# Patient Record
Sex: Male | Born: 1960 | Race: Black or African American | Hispanic: No | Marital: Married | Smoking: Former smoker
Health system: Southern US, Community
[De-identification: ages and names within clinical notes are randomized; demographics above are authoritative.]

## PROBLEM LIST (undated history)

## (undated) DIAGNOSIS — I10 Essential (primary) hypertension: Secondary | ICD-10-CM

## (undated) HISTORY — DX: Essential (primary) hypertension: I10

---

## 1986-11-04 HISTORY — PX: HEMORROIDECTOMY: SUR656

## 2017-04-12 ENCOUNTER — Encounter (HOSPITAL_COMMUNITY): Payer: Self-pay | Admitting: Emergency Medicine

## 2017-04-12 ENCOUNTER — Emergency Department (HOSPITAL_COMMUNITY)
Admission: EM | Admit: 2017-04-12 | Discharge: 2017-04-12 | Disposition: A | Discharge status: Home / Self Care | Payer: Self-pay | Attending: Emergency Medicine | Admitting: Emergency Medicine

## 2017-04-12 DIAGNOSIS — Z79899 Other long term (current) drug therapy: Secondary | ICD-10-CM | POA: Insufficient documentation

## 2017-04-12 DIAGNOSIS — R040 Epistaxis: Secondary | ICD-10-CM | POA: Insufficient documentation

## 2017-04-12 DIAGNOSIS — I10 Essential (primary) hypertension: Secondary | ICD-10-CM | POA: Insufficient documentation

## 2017-04-12 DIAGNOSIS — F17200 Nicotine dependence, unspecified, uncomplicated: Secondary | ICD-10-CM | POA: Insufficient documentation

## 2017-04-12 MED ORDER — SALINE SPRAY 0.65 % NA SOLN: 2.0000 | NASAL | 0 refills | Status: AC | PRN

## 2017-04-12 MED ORDER — HYDROCHLOROTHIAZIDE 25 MG PO TABS: 25.0000 mg | ORAL_TABLET | Freq: Every day | ORAL | 0 refills | Status: DC

## 2017-04-12 MED ORDER — OXYMETAZOLINE HCL 0.05 % NA SOLN: 1.0000 | Freq: Two times a day (BID) | NASAL | 0 refills | Status: AC | PRN

## 2017-04-12 NOTE — Discharge Instructions (Signed)
Read the information below.  Use the prescribed medication as directed.  Please discuss all new medications with your pharmacist.  You may return to the Emergency Department at any time for worsening condition or any new symptoms that concern you.    Nosebleeds commonly recur.  If your nose starts bleeding again and doesn't stop after 10-15 minutes of constant pressure squeezing the soft part of your nose, then re-apply the direct pressure and return to the ED. Return also immediately to the nearest ED if you develop chest pain, shortness of breath, dizziness or fainting, severe bleeding, or other concerns.

## 2017-04-12 NOTE — ED Triage Notes (Signed)
Patient here from home with complaints of nosebleed " for a couple of weeks now off and on". Unable to control bleeding. Currently bleeding from the left side.

## 2017-04-12 NOTE — ED Provider Notes (Signed)
[facility]-EMERGENCY DEPT Provider Note   CSN: [id number] Arrival date & time: 04/12/17  1740   By signing my name below, I, Clarisse Gouge, attest that this documentation has been prepared under the direction and in the presence of [facility], PA-C. Electronically Signed: Clarisse Gouge, Scribe. 04/12/17. 6:08 PM.   History   Chief Complaint Chief Complaint  Patient presents with  . Epistaxis   The history is provided by the patient and medical records. A language interpreter was used (Pt's son acting as interpretor on evaluation.).    Ruben Mckinney is a 56 y.o. male presenting to the Emergency Department with chief complaint of persistent nosebleeds on th L sided x 8 days. Pt also notes L sided facial pain. 3 episodes of nosebleeds noted today; pt has had 1 episode daily otherwise since onset. He states today's episodes lasted ~10 minutes at a time and subsided with pressure. Pt currently taking prescribed 10 mg amlodipine once nightly from [place] to control chronic HTN. Pt also taking aspirin daily. He notes recent blood pressure averages of 130/90-180/90 at home. Pt denies any other blood thinner use. No PCP yet. pt moved to this country ~3 months ago. No recent URI symptoms noted. No seasonal allergies.   History reviewed. No pertinent past medical history.  There are no active problems to display for this patient.   History reviewed. No pertinent surgical history.     Home Medications    Prior to Admission medications   Medication Sig Start Date End Date Taking? Authorizing Provider  hydrochlorothiazide (HYDRODIURIL) 25 MG tablet Take 1 tablet (25 mg total) by mouth daily. For high blood pressure 04/12/17   Nicholis Stepanek, PA-C  oxymetazoline (AFRIN NASAL SPRAY) 0.05 % nasal spray Place 1 spray into left nostril 2 (two) times daily as needed (nosebleed). 04/12/17   Trixie Dredge, PA-C  sodium chloride (OCEAN) 0.65 % SOLN nasal spray Place 2 sprays into both nostrils as needed for congestion  (moisturizing). 04/12/17   Trixie Dredge, PA-C    Family History No family history on file.  Social History Social History  Substance Use Topics  . Smoking status: Current Every Day Smoker  . Smokeless tobacco: Never Used  . Alcohol use No     Allergies   Patient has no allergy information on record.   Review of Systems Review of Systems  Constitutional: Negative for chills and fever.  HENT: Positive for nosebleeds. Negative for congestion, facial swelling, rhinorrhea, sinus pain, sinus pressure, sneezing and trouble swallowing.   Respiratory: Negative for cough.   Allergic/Immunologic: Negative for environmental allergies and immunocompromised state.  All other systems reviewed and are negative.    Physical Exam Updated Vital Signs BP (!) 196/86 (BP Location: Left Arm)   Pulse 94   Resp 16   SpO2 99%   Physical Exam  Constitutional: He appears well-developed and well-nourished. No distress.  HENT:  Head: Normocephalic and atraumatic.  Mouth/Throat: Oropharynx is clear and moist and mucous membranes are normal. No oropharyngeal exudate, posterior oropharyngeal edema, posterior oropharyngeal erythema or tonsillar abscesses.  Small amount of dried blood at the tip of the L nare. No active bleeding.  Eyes: Conjunctivae and EOM are normal. Right eye exhibits no discharge. Left eye exhibits no discharge.  Neck: Normal range of motion. Neck supple.  Cardiovascular: Normal rate and regular rhythm.   Pulmonary/Chest: Effort normal and breath sounds normal. No stridor. No respiratory distress. He has no wheezes. He has no rales.  Lymphadenopathy:    He  has no cervical adenopathy.  Neurological: He is alert.  Skin: He is not diaphoretic.  Nursing note and vitals reviewed.    ED Treatments / Results  DIAGNOSTIC STUDIES: Oxygen Saturation is 99% on RA, NL by my interpretation.    COORDINATION OF CARE: 6:04 PM-Discussed next steps with pt. Pt verbalized understanding and is  agreeable with the plan. Will order medication and refer to PCP and HENT specialist.   Labs (all labs ordered are listed, but only abnormal results are displayed) Labs Reviewed - No data to display  EKG  EKG Interpretation None       Radiology No results found.  Procedures Procedures (including critical care time)  Medications Ordered in ED Medications - No data to display   Initial Impression / Assessment and Plan / ED Course  I have reviewed the triage vital signs and the nursing notes.  Pertinent labs & imaging results that were available during my care of the patient were reviewed by me and considered in my medical decision making (see chart for details).     Afebrile, nontoxic patient with intermittent epistaxis from the left nare over the past 8 days.  No active bleeding currently.  No vessels noted on left that need cautery.  Pt is hypertensive, reports persistent hypertension despite taking amlodipine 10mg  nightly.  No URI symptoms, no seasonal allergies, no sinus tenderness or suspected sinusitis.   D/C home with nasal sprays, addition of HCTZ for better blood pressure control, referrals for PCP follow up, ENT follow up PRN.  Discussed result, findings, treatment, and follow up  with patient.  Pt given return precautions.  Pt verbalizes understanding and agrees with plan.       Final Clinical Impressions(s) / ED Diagnoses   Final diagnoses:  Left-sided epistaxis  Hypertension, unspecified type    New Prescriptions Discharge Medication List as of 04/12/2017  6:24 PM    START taking these medications   Details  hydrochlorothiazide (HYDRODIURIL) 25 MG tablet Take 1 tablet (25 mg total) by mouth daily. For high blood pressure, Starting Sat 04/12/2017, Print    oxymetazoline (AFRIN NASAL SPRAY) 0.05 % nasal spray Place 1 spray into left nostril 2 (two) times daily as needed (nosebleed)., Starting Sat 04/12/2017, Print    sodium chloride (OCEAN) 0.65 % SOLN nasal spray  Place 2 sprays into both nostrils as needed for congestion (moisturizing)., Starting Sat 04/12/2017, Print        I personally performed the services described in this documentation, which was scribed in my presence. The recorded information has been reviewed and is accurate.    Trixie DredgeWest, Jaskaran Dauzat, PA-C 04/12/17 1843    Trixie DredgeWest, Kamaal Cast, PA-C 04/12/17 1845    Tilden Fossaees, Elizabeth, MD 04/13/17 [phone]

## 2017-08-21 ENCOUNTER — Ambulatory Visit: Payer: BLUE CROSS/BLUE SHIELD | Admitting: Family Medicine

## 2017-08-22 ENCOUNTER — Encounter: Payer: Self-pay | Admitting: Family Medicine

## 2017-08-22 ENCOUNTER — Ambulatory Visit (INDEPENDENT_AMBULATORY_CARE_PROVIDER_SITE_OTHER): Payer: BLUE CROSS/BLUE SHIELD

## 2017-08-22 VITALS — BP 132/82 | HR 77 | Temp 98.8°F | Resp 17 | Ht 71.0 in | Wt 250.0 lb

## 2017-08-22 DIAGNOSIS — J189 Pneumonia, unspecified organism: Secondary | ICD-10-CM

## 2017-08-22 DIAGNOSIS — R062 Wheezing: Secondary | ICD-10-CM | POA: Diagnosis not present

## 2017-08-22 DIAGNOSIS — I10 Essential (primary) hypertension: Secondary | ICD-10-CM

## 2017-08-22 DIAGNOSIS — R6889 Other general symptoms and signs: Secondary | ICD-10-CM

## 2017-08-22 DIAGNOSIS — R05 Cough: Secondary | ICD-10-CM

## 2017-08-22 DIAGNOSIS — R059 Cough, unspecified: Secondary | ICD-10-CM

## 2017-08-22 DIAGNOSIS — J181 Lobar pneumonia, unspecified organism: Secondary | ICD-10-CM | POA: Diagnosis not present

## 2017-08-22 DIAGNOSIS — J069 Acute upper respiratory infection, unspecified: Secondary | ICD-10-CM

## 2017-08-22 LAB — POCT CBC
Granulocyte percent: 77.3 %G (ref 37–80)
HEMATOCRIT: 41.3 % — AB (ref 43.5–53.7)
Hemoglobin: 13.8 g/dL — AB (ref 14.1–18.1)
Lymph, poc: 1.5 (ref 0.6–3.4)
MCH, POC: 29.4 pg (ref 27–31.2)
MCHC: 33.4 g/dL (ref 31.8–35.4)
MCV: 87.9 fL (ref 80–97)
MID (cbc): 0.8 (ref 0–0.9)
MPV: 6.6 fL (ref 0–99.8)
POC GRANULOCYTE: 8 — AB (ref 2–6.9)
POC LYMPH %: 14.7 % (ref 10–50)
POC MID %: 8 %M (ref 0–12)
Platelet Count, POC: 311 10*3/uL (ref 142–424)
RBC: 4.7 M/uL (ref 4.69–6.13)
RDW, POC: 13.8 %
WBC: 10.4 10*3/uL — AB (ref 4.6–10.2)

## 2017-08-22 MED ORDER — AMOXICILLIN-POT CLAVULANATE 875-125 MG PO TABS: 1.0000 | ORAL_TABLET | Freq: Two times a day (BID) | ORAL | 0 refills | Status: AC

## 2017-08-22 MED ORDER — BENZONATATE 100 MG PO CAPS: 100.0000 mg | ORAL_CAPSULE | Freq: Three times a day (TID) | ORAL | 0 refills | Status: AC | PRN

## 2017-08-22 MED ORDER — HYDROCHLOROTHIAZIDE 25 MG PO TABS: 25.0000 mg | ORAL_TABLET | Freq: Every day | ORAL | 1 refills | Status: DC

## 2017-08-22 MED ORDER — CEFTRIAXONE SODIUM 1 G IJ SOLR
1.0000 g | Freq: Once | INTRAMUSCULAR | Status: AC
  Administered 2017-08-22: 1 g via INTRAMUSCULAR

## 2017-08-22 MED ORDER — ALBUTEROL SULFATE (2.5 MG/3ML) 0.083% IN NEBU
2.5000 mg | INHALATION_SOLUTION | Freq: Once | RESPIRATORY_TRACT | Status: AC
  Administered 2017-08-22: 2.5 mg via RESPIRATORY_TRACT

## 2017-08-22 NOTE — Patient Instructions (Addendum)
Drink lots of fluids (water, juice)  Take amoxicillin clavulanate antibiotic 875 mg (Augmentin) twice daily  You have received a shot of ceftriaxone 1 gm antibiotic  Use the albuterol inhaler 2 puffs every 4-6 hours.    Take benzonotate cough pills 1 or 2 pills every 8 hours for cough  Take over-the-counter robitussin DM cough syrup if needed for cough  Stay off work until Tuesday 08/26/17  If worse return or go to the emergency room  Continue same blood pressure medicine   Community-Acquired Pneumonia, Adult Pneumonia is an infection of the lungs. One type of pneumonia can happen while a person is in a hospital. A different type can happen when a person is not in a hospital (community-acquired pneumonia). It is easy for this kind to spread from person to person. It can spread to you if you breathe near an infected person who coughs or sneezes. Some symptoms include:  A dry cough.  A wet (productive) cough.  Fever.  Sweating.  Chest pain.  Follow these instructions at home:  Take over-the-counter and prescription medicines only as told by your doctor. ? Only take cough medicine if you are losing sleep. ? If you were prescribed an antibiotic medicine, take it as told by your doctor. Do not stop taking the antibiotic even if you start to feel better.  Sleep with your head and neck raised (elevated). You can do this by putting a few pillows under your head, or you can sleep in a recliner.  Do not use tobacco products. These include cigarettes, chewing tobacco, and e-cigarettes. If you need help quitting, ask your doctor.  Drink enough water to keep your pee (urine) clear or pale yellow. A shot (vaccine) can help prevent pneumonia. Shots are often suggested for:  People older than 57 years of age.  People older than 57 years of age: ? Who are having cancer treatment. ? Who have long-term (chronic) lung disease. ? Who have problems with their body's defense system  (immune system).  You may also prevent pneumonia if you take these actions:  Get the flu (influenza) shot every year.  Go to the dentist as often as told.  Wash your hands often. If soap and water are not available, use hand sanitizer.  Contact a doctor if:  You have a fever.  You lose sleep because your cough medicine does not help. Get help right away if:  You are short of breath and it gets worse.  You have more chest pain.  Your sickness gets worse. This is very serious if: ? You are an older adult. ? Your body's defense system is weak.  You cough up blood. This information is not intended to replace advice given to you by your health care provider. Make sure you discuss any questions you have with your health care provider. Document Released: 04/08/2008 Document Revised: 03/28/2016 Document Reviewed: 02/15/2015 Elsevier Interactive Patient Education  2018 [employer]   IF you received an x-ray today, you will receive an invoice from [facility]. Please contact [facility] at [phone] with questions or concerns regarding your invoice.   IF you received labwork today, you will receive an invoice from [facility]. Please contact [facility] at [phone] with questions or concerns regarding your invoice.   Our billing staff will not be able to assist you with questions regarding bills from these companies.  You will be contacted with the lab results as soon as they are available. The fastest way to get your results  is to activate your My Chart account. Instructions are located on the last page of this paperwork. If you have not heard from us regarding the results in 2 weeks, please contact this office.     

## 2017-08-22 NOTE — Progress Notes (Signed)
Patient ID: Ruben Mckinney, male    DOB: April 28, 1961  Age: 56 y.o. MRN: [id number]  Chief Complaint  Patient presents with  . Cough  . Nasal Congestion  . Sore Throat    Subjective:   56 year old man from [place] who is been here for about 6 months. He speaks broken [place]. He has been sick for several days with a cough, nasal congestion and sore throat. He does not smoke. His wife was sick but she got over it without seeing a doctor. The patient actually has been sick since early last week. He continues to go to work every day. He works a long way from here at a [employer]. He has a history of hypertension and is on medication for that. He also has had problems with epistaxis in the past. He has not been running a documented fever but he does feel bad. He is coughing up purulent phlegm.  Current allergies, medications, problem list, past/family and social histories reviewed.  Objective:  BP 132/82   Pulse 77   Temp 98.8 F (37.1 C) (Oral)   Resp 17   Ht 5\' 11"  (1.803 m)   Wt 250 lb (113.4 kg)   SpO2 98%   BMI 34.87 kg/m   No major distress but he does look ill. His TMs are normal. Throat mildly erythematous without exudate. Neck supple without nodes. Chest has scattered wheezes bilaterally, more in the left lower lung area. Heart regular without murmur. Abdomen soft without mass or tenderness.  Assessment & Plan:   Assessment: 1. Pneumonia of both lower lobes due to infectious organism (HCC)   2. Cough   3. Wheeze   4. Acute upper respiratory infection   5. Flu-like symptoms   6. Essential hypertension       Plan: Will give an albuterol treatment and proceed from there.    Orders Placed This Encounter  Procedures  . DG Chest 2 View    Standing Status:   Future    Number of Occurrences:   1    Standing Expiration Date:   08/22/2018    Order Specific Question:   Reason for Exam (SYMPTOM  OR DIAGNOSIS REQUIRED)    Answer:   wheezing    Order Specific Question:    Preferred imaging location?    Answer:   External  . POCT CBC    Meds ordered this encounter  Medications  . albuterol (PROVENTIL) (2.5 MG/3ML) 0.083% nebulizer solution 2.5 mg  . cefTRIAXone (ROCEPHIN) injection 1 g  . amoxicillin-clavulanate (AUGMENTIN) 875-125 MG tablet    Sig: Take 1 tablet by mouth 2 (two) times daily.    Dispense:  20 tablet    Refill:  0  . benzonatate (TESSALON) 100 MG capsule    Sig: Take 1-2 capsules (100-200 mg total) by mouth 3 (three) times daily as needed.    Dispense:  30 capsule    Refill:  0  . hydrochlorothiazide (HYDRODIURIL) 25 MG tablet    Sig: Take 1 tablet (25 mg total) by mouth daily. For high blood pressure    Dispense:  90 tablet    Refill:  1    Results for orders placed or performed in visit on 08/22/17  POCT CBC  Result Value Ref Range   WBC 10.4 (A) 4.6 - 10.2 K/uL   Lymph, poc 1.5 0.6 - 3.4   POC LYMPH PERCENT 14.7 10 - 50 %L   MID (cbc) 0.8 0 - 0.9   POC MID %  8.0 0 - 12 %M   POC Granulocyte 8.0 (A) 2 - 6.9   Granulocyte percent 77.3 37 - 80 %G   RBC 4.70 4.69 - 6.13 M/uL   Hemoglobin 13.8 (A) 14.1 - 18.1 g/dL   HCT, POC 13.041.3 (A) 86.543.5 - 53.7 %   MCV 87.9 80 - 97 fL   MCH, POC 29.4 27 - 31.2 pg   MCHC 33.4 31.8 - 35.4 g/dL   RDW, POC 78.413.8 %   Platelet Count, POC 311 142 - 424 K/uL   MPV 6.6 0 - 99.8 fL    Explained through interpreter.    Patient Instructions    Drink lots of fluids (water, juice)  Take amoxicillin clavulanate antibiotic 875 mg (Augmentin) twice daily  You have received a shot of ceftriaxone 1 gm antibiotic  Use the albuterol inhaler 2 puffs every 4-6 hours.    Take benzonotate cough pills 1 or 2 pills every 8 hours for cough  Take over-the-counter robitussin DM cough syrup if needed for cough  Stay off work until Tuesday 08/26/17  If worse return or go to the emergency room  Continue same blood pressure medicine   IF you received an x-ray today, you will receive an invoice from  [facility]. Please contact [facility] at [phone] with questions or concerns regarding your invoice.   IF you received labwork today, you will receive an invoice from [facility]. Please contact [facility] at [phone] with questions or concerns regarding your invoice.   Our billing staff will not be able to assist you with questions regarding bills from these companies.  You will be contacted with the lab results as soon as they are available. The fastest way to get your results is to activate your My Chart account. Instructions are located on the last page of this paperwork. If you have not heard from us regarding the results in 2 weeks, please contact this office.         No Follow-up on file.   HOPPER,DAVID, MD 08/22/2017

## 2017-08-23 ENCOUNTER — Other Ambulatory Visit: Payer: Self-pay | Admitting: Family Medicine

## 2017-08-23 MED ORDER — ALBUTEROL SULFATE HFA 108 (90 BASE) MCG/ACT IN AERS: 2.0000 | INHALATION_SPRAY | Freq: Four times a day (QID) | RESPIRATORY_TRACT | 0 refills | Status: DC | PRN

## 2017-08-23 NOTE — Progress Notes (Signed)
Family notified that medication is at [facility] - albuterol was prescribed but not called in  Sent electronically

## 2017-08-25 ENCOUNTER — Ambulatory Visit (INDEPENDENT_AMBULATORY_CARE_PROVIDER_SITE_OTHER): Payer: BLUE CROSS/BLUE SHIELD | Admitting: Family Medicine

## 2017-08-25 ENCOUNTER — Encounter: Payer: Self-pay | Admitting: Family Medicine

## 2017-08-25 VITALS — BP 132/76 | HR 78 | Temp 98.6°F | Resp 16 | Ht 70.0 in | Wt 244.4 lb

## 2017-08-25 DIAGNOSIS — J189 Pneumonia, unspecified organism: Secondary | ICD-10-CM

## 2017-08-25 DIAGNOSIS — R062 Wheezing: Secondary | ICD-10-CM

## 2017-08-25 DIAGNOSIS — Z789 Other specified health status: Secondary | ICD-10-CM

## 2017-08-25 DIAGNOSIS — J181 Lobar pneumonia, unspecified organism: Secondary | ICD-10-CM

## 2017-08-25 MED ORDER — PREDNISONE 20 MG PO TABS: ORAL_TABLET | ORAL | 0 refills | Status: AC

## 2017-08-25 MED ORDER — ALBUTEROL SULFATE HFA 108 (90 BASE) MCG/ACT IN AERS: 2.0000 | INHALATION_SPRAY | Freq: Four times a day (QID) | RESPIRATORY_TRACT | 0 refills | Status: AC | PRN

## 2017-08-25 NOTE — Progress Notes (Addendum)
Patient ID: Ruben Mckinney, male    DOB: June 27, 1961  Age: 56 y.o. MRN: [id number]  Chief Complaint  Patient presents with  . Follow-up    1019/2018 Pneumonia  per patient some improvement, but need more rest    Subjective:   Patient is here for follow-up of his pneumonia. He is breathing better but still short of breath and feels like he needs to be off work while longer he was not taking the Augmentin correctly. He only took it once a day. We went on line with the interpreter and had a long discussion explaining everything to him and his son and I believe they understand it now. They have only been in the [place] for about 6 months from [place].   No fever. Wheezing.   Current allergies, medications, problem list, past/family and social histories reviewed.  Objective:  BP 132/76 (BP Location: Left Arm, Patient Position: Sitting, Cuff Size: Large)   Pulse 78   Temp 98.6 F (37 C) (Oral)   Resp 16   Ht 5\' 10"  (1.778 m)   Wt 244 lb 6.4 oz (110.9 kg)   SpO2 96%   BMI 35.07 kg/m   No major acute distress. Scattered rhonchi and wheezes throughout both lungs but seems to have a little better air movement than he did last week. He was not clear on the use of the inhaler so that was explained and demonstrated to him also.  Assessment & Plan:   Assessment: 1. Pneumonia of both lower lobes due to infectious organism (HCC)   2. Wheezing   3. Language barrier       Plan: Pneumonia slowly improving, continue medications, stay off work a few more days, return if worse. See instructions. Spent 30 minutes with online interpreter explaining everything.    No orders of the defined types were placed in this encounter.   Meds ordered this encounter  Medications  . albuterol (PROVENTIL HFA;VENTOLIN HFA) 108 (90 Base) MCG/ACT inhaler    Sig: Inhale 2 puffs into the lungs every 6 (six) hours as needed for wheezing or shortness of breath.    Dispense:  1 Inhaler    Refill:  0  . predniSONE  (DELTASONE) 20 MG tablet    Sig: Take 3 daily for 2 days then 2 daily for 2 days then 1 daily for 2 days.    Dispense:  12 tablet    Refill:  0         Patient Instructions   Drink lots of fluids (water, juice)  Continue to take amoxicillin clavulanate antibiotic 875 mg (Augmentin) twice (1x2x10) daily  Use the albuterol inhaler 2 puffs every 4-6 hours.  Only use if needed for wheezing or shortness of breath.  Take prednisone 20 mg 3 pills x1 x2 2 days, then 2x1x2, then 1x1x2    Continue to take benzonotate cough pills 1 or 2 pills every 8 hours for cough  Take over-the-counter robitussin DM cough syrup if needed for cough  Stay off work until Thursday 08/28/17  If worse return or go to the emergency room  Continue same blood pressure medicine  Return if worse or not improving.    IF you received an x-ray today, you will receive an invoice from [facility]. Please contact [facility] at [phone] with questions or concerns regarding your invoice.   IF you received labwork today, you will receive an invoice from [place]. Please contact [facility] at [phone] with questions or concerns regarding your invoice.  Our billing staff will not be able to assist you with questions regarding bills from these companies.  You will be contacted with the lab results as soon as they are available. The fastest way to get your results is to activate your My Chart account. Instructions are located on the last page of this paperwork. If you have not heard from us regarding the results in 2 weeks, please contact this office.        No Follow-up on file.   Giannie Soliday, MD 08/25/2017

## 2017-08-25 NOTE — Patient Instructions (Addendum)
Drink lots of fluids (water, juice)  Continue to take amoxicillin clavulanate antibiotic 875 mg (Augmentin) twice (1x2x10) daily  Use the albuterol inhaler 2 puffs every 4-6 hours.  Only use if needed for wheezing or shortness of breath.  Take prednisone 20 mg 3 pills x1 x2 2 days, then 2x1x2, then 1x1x2    Continue to take benzonotate cough pills 1 or 2 pills every 8 hours for cough  Take over-the-counter robitussin DM cough syrup if needed for cough  Stay off work until Thursday 08/28/17  If worse return or go to the emergency room  Continue same blood pressure medicine  Return if worse or not improving.    IF you received an x-ray today, you will receive an invoice from [facility]. Please contact [facility] at [phone] with questions or concerns regarding your invoice.   IF you received labwork today, you will receive an invoice from [facility]. Please contact [facility] at [phone] with questions or concerns regarding your invoice.   Our billing staff will not be able to assist you with questions regarding bills from these companies.  You will be contacted with the lab results as soon as they are available. The fastest way to get your results is to activate your My Chart account. Instructions are located on the last page of this paperwork. If you have not heard from us regarding the results in 2 weeks, please contact this office.

## 2017-08-30 ENCOUNTER — Ambulatory Visit (INDEPENDENT_AMBULATORY_CARE_PROVIDER_SITE_OTHER): Payer: BLUE CROSS/BLUE SHIELD | Admitting: Physician Assistant

## 2017-08-30 ENCOUNTER — Encounter: Payer: Self-pay | Admitting: Physician Assistant

## 2017-08-30 VITALS — BP 122/86 | HR 82 | Temp 98.2°F | Resp 18 | Ht 70.0 in | Wt 248.0 lb

## 2017-08-30 DIAGNOSIS — J181 Lobar pneumonia, unspecified organism: Secondary | ICD-10-CM | POA: Diagnosis not present

## 2017-08-30 DIAGNOSIS — J189 Pneumonia, unspecified organism: Secondary | ICD-10-CM

## 2017-08-30 NOTE — Patient Instructions (Addendum)
  I think it is okay that you go back to work on Monday as long as you are still feeling as well as you are today.  I recommend taking it easy at work and not exerting herself to your full capacity at this time.  Advance work as tolerated.  Also make sure you complete your antibiotics.  Make sure you wear your protective gear at work.  Please return in 4 weeks for repeat chest x-ray to ensure that your pneumonia has fully resolved.  Please return sooner if any of her symptoms return or start to worsen. Thank you for letting me participate in your health and well being.    IF you received an x-ray today, you will receive an invoice from [facility]. Please contact [facility] at [phone] with questions or concerns regarding your invoice.   IF you received labwork today, you will receive an invoice from [facility]. Please contact [facility] at [phone] with questions or concerns regarding your invoice.   Our billing staff will not be able to assist you with questions regarding bills from these companies.  You will be contacted with the lab results as soon as they are available. The fastest way to get your results is to activate your My Chart account. Instructions are located on the last page of this paperwork. If you have not heard from us regarding the results in 2 weeks, please contact this office.

## 2017-08-30 NOTE — Progress Notes (Signed)
Ruben RousHamed Mckinney  MRN: [id number] DOB: 11/01/1961  Subjective:  Ruben Mckinney is a 56 y.o. male seen in office today for a chief complaint of work clearance.  Patient initially seen on 08/22/17 by Dr. Alwyn RenHopper and found to have bibasilar pneumonia.  He was treated with Augmentin.Returned on 08/25/17 and was doing better but noted he was still SOB and felt as if he needed to be out of work a little while longer. He has been resting since then. Taking abx as prescribed.  Patient reports he is doing much better better.  He feels back to himself.  Has occasional cough in the morning.  Denies purulent sputum production, fatigue, chest pain, shortness of breath, fever, chills, diaphoresis, and sore throat.  He works at the [employer] and has a form that needs to be completed before he can go back to work.  He wears  protective gear while at work.  He believes he can perform his job functions without any restrictions at this time.  Review of Systems  Per HPI  There are no active problems to display for this patient.   Current Outpatient Prescriptions on File Prior to Visit  Medication Sig Dispense Refill  . albuterol (PROVENTIL HFA;VENTOLIN HFA) 108 (90 Base) MCG/ACT inhaler Inhale 2 puffs into the lungs every 6 (six) hours as needed for wheezing or shortness of breath. 1 Inhaler 0  . amoxicillin-clavulanate (AUGMENTIN) 875-125 MG tablet Take 1 tablet by mouth 2 (two) times daily. 20 tablet 0  . benzonatate (TESSALON) 100 MG capsule Take 1-2 capsules (100-200 mg total) by mouth 3 (three) times daily as needed. 30 capsule 0  . hydrochlorothiazide (HYDRODIURIL) 25 MG tablet Take 1 tablet (25 mg total) by mouth daily. For high blood pressure 90 tablet 1  . predniSONE (DELTASONE) 20 MG tablet Take 3 daily for 2 days then 2 daily for 2 days then 1 daily for 2 days. 12 tablet 0  . oxymetazoline (AFRIN NASAL SPRAY) 0.05 % nasal spray Place 1 spray into left nostril 2 (two) times daily as needed  (nosebleed). (Patient not taking: Reported on 08/25/2017) 30 mL 0  . sodium chloride (OCEAN) 0.65 % SOLN nasal spray Place 2 sprays into both nostrils as needed for congestion (moisturizing). (Patient not taking: Reported on 08/25/2017) 480 mL 0   No current facility-administered medications on file prior to visit.     No Known Allergies   Objective:  BP 122/86   Pulse 82   Temp 98.2 F (36.8 C) (Oral)   Resp 18   Ht 5\' 10"  (1.778 m)   Wt 248 lb (112.5 kg)   SpO2 95%   BMI 35.58 kg/m   Physical Exam  Constitutional: He is oriented to person, place, and time and well-developed, well-nourished, and in no distress.  HENT:  Head: Normocephalic and atraumatic.  Mouth/Throat: Uvula is midline, oropharynx is clear and moist and mucous membranes are normal.  Eyes: Conjunctivae are normal.  Neck: Normal range of motion.  Pulmonary/Chest: Effort normal. He has no decreased breath sounds. He has wheezes. He has rhonchi (mild scattered rhonchi and wheezes noted, more noticable on left lower lobe). He has no rales.  Neurological: He is alert and oriented to person, place, and time. Gait normal.  Skin: Skin is warm and dry.  Psychiatric: Affect normal.  Vitals reviewed.  Pulse oximetry while ambulating remains 96-98%  Assessment and Plan :  1. Pneumonia of both lower lobes due to infectious organism [facility](HCC) Improving. I believe  pt can return to work on Monday, 09/01/17. I have completed the paperwork. Encouraged him to take it easy for a few days and advance work as tolerated.  Complete abx. Return if any of his sx worsen. Otherwise, return in 4 weeks for repeat CXR to ensure resolution of pneumonia.  - Check Pulse Oximetry while ambulating   Benjiman Core PA-C  [facility] at [facility] 08/30/2017 2:14 PM

## 2017-09-29 ENCOUNTER — Ambulatory Visit: Payer: BLUE CROSS/BLUE SHIELD | Admitting: Family Medicine

## 2017-09-29 ENCOUNTER — Ambulatory Visit (INDEPENDENT_AMBULATORY_CARE_PROVIDER_SITE_OTHER): Payer: BLUE CROSS/BLUE SHIELD

## 2017-09-29 ENCOUNTER — Encounter: Payer: Self-pay | Admitting: Family Medicine

## 2017-09-29 ENCOUNTER — Other Ambulatory Visit: Payer: Self-pay

## 2017-09-29 VITALS — BP 150/90 | HR 62 | Temp 98.4°F | Resp 16 | Ht 70.0 in | Wt 244.6 lb

## 2017-09-29 DIAGNOSIS — M25562 Pain in left knee: Secondary | ICD-10-CM

## 2017-09-29 DIAGNOSIS — G8929 Other chronic pain: Secondary | ICD-10-CM | POA: Diagnosis not present

## 2017-09-29 DIAGNOSIS — J189 Pneumonia, unspecified organism: Secondary | ICD-10-CM

## 2017-09-29 DIAGNOSIS — J181 Lobar pneumonia, unspecified organism: Secondary | ICD-10-CM | POA: Diagnosis not present

## 2017-09-29 DIAGNOSIS — I10 Essential (primary) hypertension: Secondary | ICD-10-CM | POA: Diagnosis not present

## 2017-09-29 DIAGNOSIS — M25561 Pain in right knee: Secondary | ICD-10-CM

## 2017-09-29 MED ORDER — VALSARTAN-HYDROCHLOROTHIAZIDE 160-25 MG PO TABS: 1.0000 | ORAL_TABLET | Freq: Every day | ORAL | 1 refills | Status: AC

## 2017-09-29 NOTE — Progress Notes (Signed)
Patient ID: Ruben Mckinney, male    DOB: 05/18/1961  Age: 56 y.o. MRN: [id number]  Chief Complaint  Patient presents with  . Follow-up    recheck for pneumonia     Subjective:   Patient is here for follow-up with regard to his pneumonia. He also brought in his blood pressure readings over the last month, most of which are mildly elevated in the systolic and around 80 and the diastolic. He is occasionally coughing and brings up a little plug of mucus. He has problems with his knees hurting him when he standing at the workplace or going up and down stairs. Otherwise he is been doing pretty well. His son who speaks [place] and Arabic came and interpreted for him though the patient was able to answer many things with broken [place].  Current allergies, medications, problem list, past/family and social histories reviewed.  Objective:  BP (!) 150/90   Pulse 62   Temp 98.4 F (36.9 C)   Resp 16   Ht 5\' 10"  (1.778 m)   Wt 244 lb 9.6 oz (110.9 kg)   SpO2 98%   BMI 35.10 kg/m   No acute distress. Throat clear. Neck supple without nodes. Chest clear drawstrings. Heart regular without murmurs.  Assessment & Plan:   Assessment: 1. Pneumonia of both lower lobes due to infectious organism (HCC)   2. Chronic pain of both knees   3. Essential hypertension       Plan: Chest x-ray for follow-up I am changing you to a stronger blood pressure medicine.  Orders Placed This Encounter  Procedures  . DG Chest 2 View    Standing Status:   Future    Number of Occurrences:   1    Standing Expiration Date:   09/29/2018    Order Specific Question:   Reason for Exam (SYMPTOM  OR DIAGNOSIS REQUIRED)    Answer:   pneumonia follow up    Order Specific Question:   Preferred imaging location?    Answer:   External    No orders of the defined types were placed in this encounter.        Patient Instructions   Return if you get worse.  No more treatment needed.    Take tylenol (acetominophen)  500 mg  2 pills 3 times daily when needed for knee pain.  Try to lose weight  We will change blood pressure medicine to valsartan-hct one daily.    Monitor blood pressure to be sure it is less than 140/90.  Plan to return in about 4 month for a recheck.     IF you received an x-ray today, you will receive an invoice from [facility]. Please contact [facility] at [phone] with questions or concerns regarding your invoice.   IF you received labwork today, you will receive an invoice from [facility]. Please contact [facility] at [phone] with questions or concerns regarding your invoice.   Our billing staff will not be able to assist you with questions regarding bills from these companies.  You will be contacted with the lab results as soon as they are available. The fastest way to get your results is to activate your My Chart account. Instructions are located on the last page of this paperwork. If you have not heard from us regarding the results in 2 weeks, please contact this office.         Return in about 4 months (around 01/27/2018), or if symptoms worsen or fail to improve, for  bp followup.   Kile Kabler, MD 09/29/2017

## 2017-09-29 NOTE — Patient Instructions (Addendum)
Return if you get worse.  No more treatment needed.    Take tylenol (acetominophen) 500 mg  2 pills 3 times daily when needed for knee pain.  Try to lose weight  We will change blood pressure medicine to valsartan-hct one daily.    Monitor blood pressure to be sure it is less than 140/90.  Plan to return in about 4 month for a recheck.     IF you received an x-ray today, you will receive an invoice from [facility]. Please contact [facility] at [phone] with questions or concerns regarding your invoice.   IF you received labwork today, you will receive an invoice from [facility]. Please contact [facility] at [phone] with questions or concerns regarding your invoice.   Our billing staff will not be able to assist you with questions regarding bills from these companies.  You will be contacted with the lab results as soon as they are available. The fastest way to get your results is to activate your My Chart account. Instructions are located on the last page of this paperwork. If you have not heard from us regarding the results in 2 weeks, please contact this office.

## 2017-12-04 ENCOUNTER — Telehealth: Payer: Self-pay

## 2017-12-04 DIAGNOSIS — I10 Essential (primary) hypertension: Secondary | ICD-10-CM

## 2017-12-04 NOTE — Telephone Encounter (Signed)
Tried to call patient to notify him that RX for valsartan has to go through mail order.  Can not be filled at local pharmacy.  The patient had a VM that has not been set up yet, so I could not leave a message.

## 2017-12-05 NOTE — Telephone Encounter (Signed)
Please fill as mail order only and send to [facility] as a 90 day supply. No PA is warranted when ordered in this fashion.

## 2017-12-11 NOTE — Telephone Encounter (Signed)
Hello Renay,  Mail order to where exactly? We do not have any mail order pharmacy listed for him just the [facility] on [address]. Thanks!

## 2018-01-23 IMAGING — DX DG CHEST 2V
2 series · 2 of 2 positions shown · non-contrast
Comparison: 08/22/2017

CLINICAL DATA: Pneumonia

EXAM:
CHEST  2 VIEW

[chest pa]
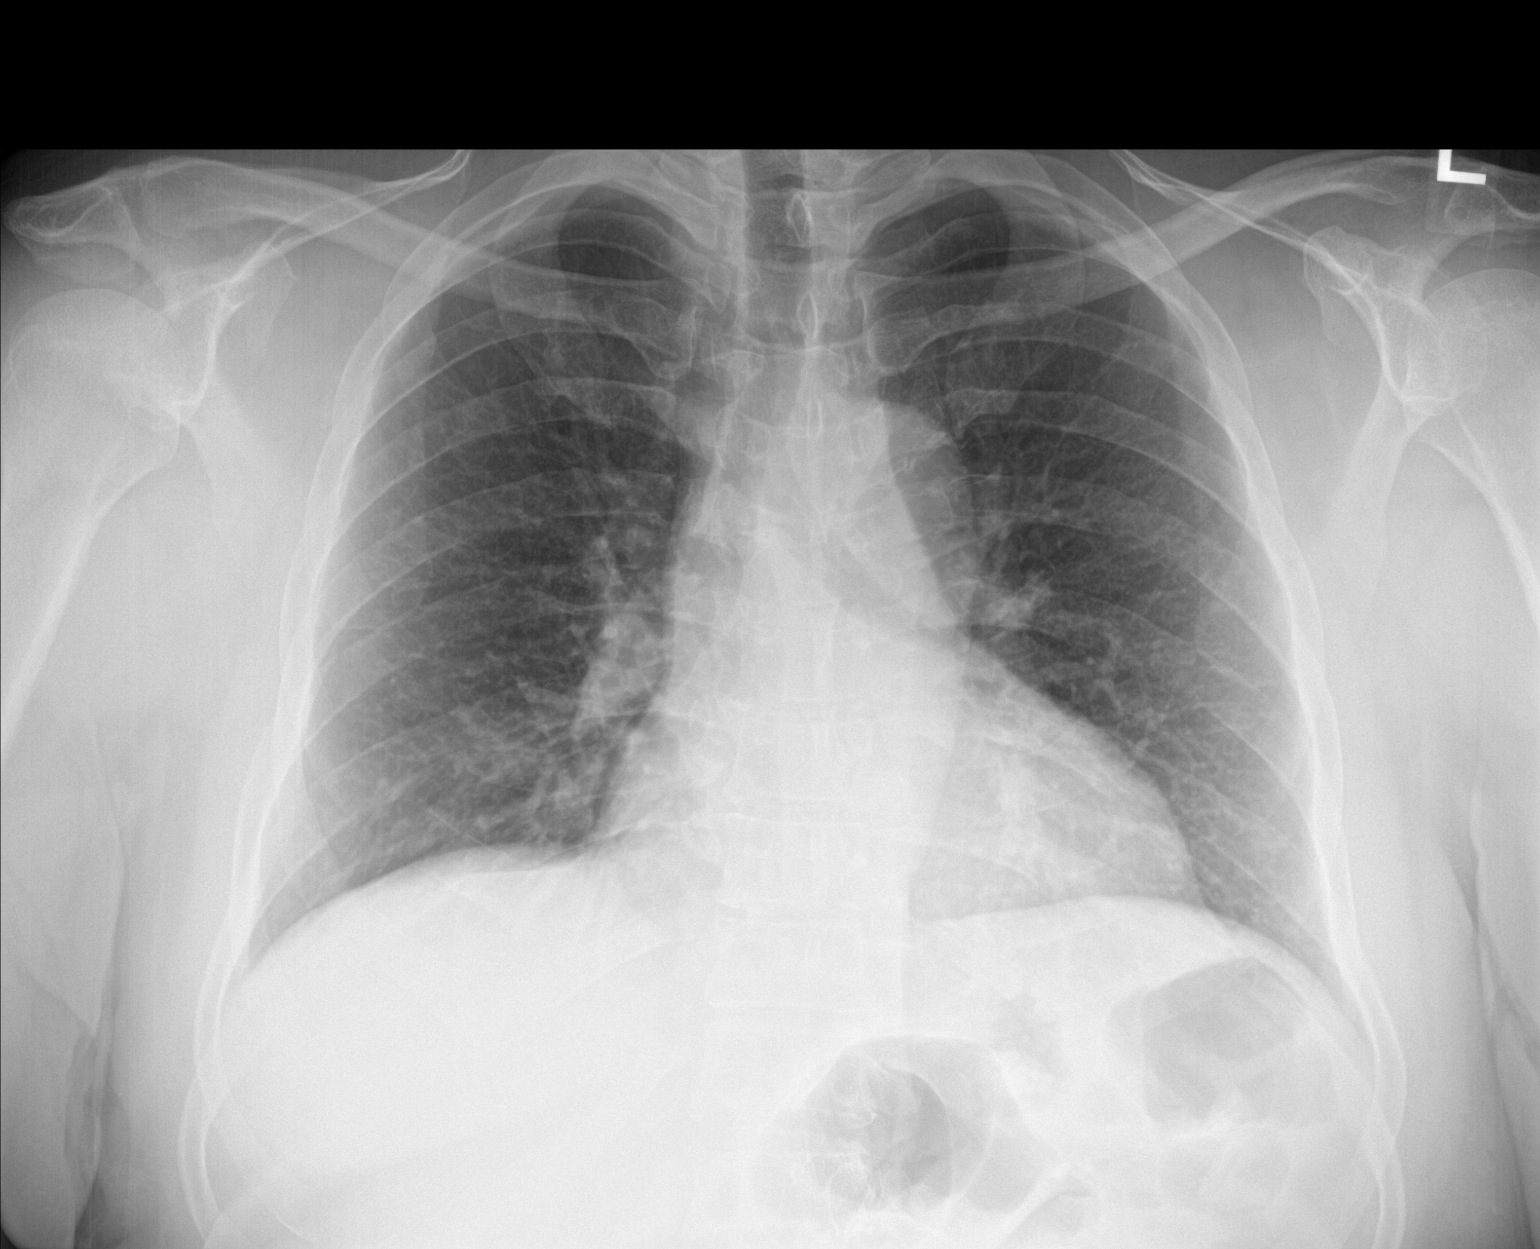

[chest lat]
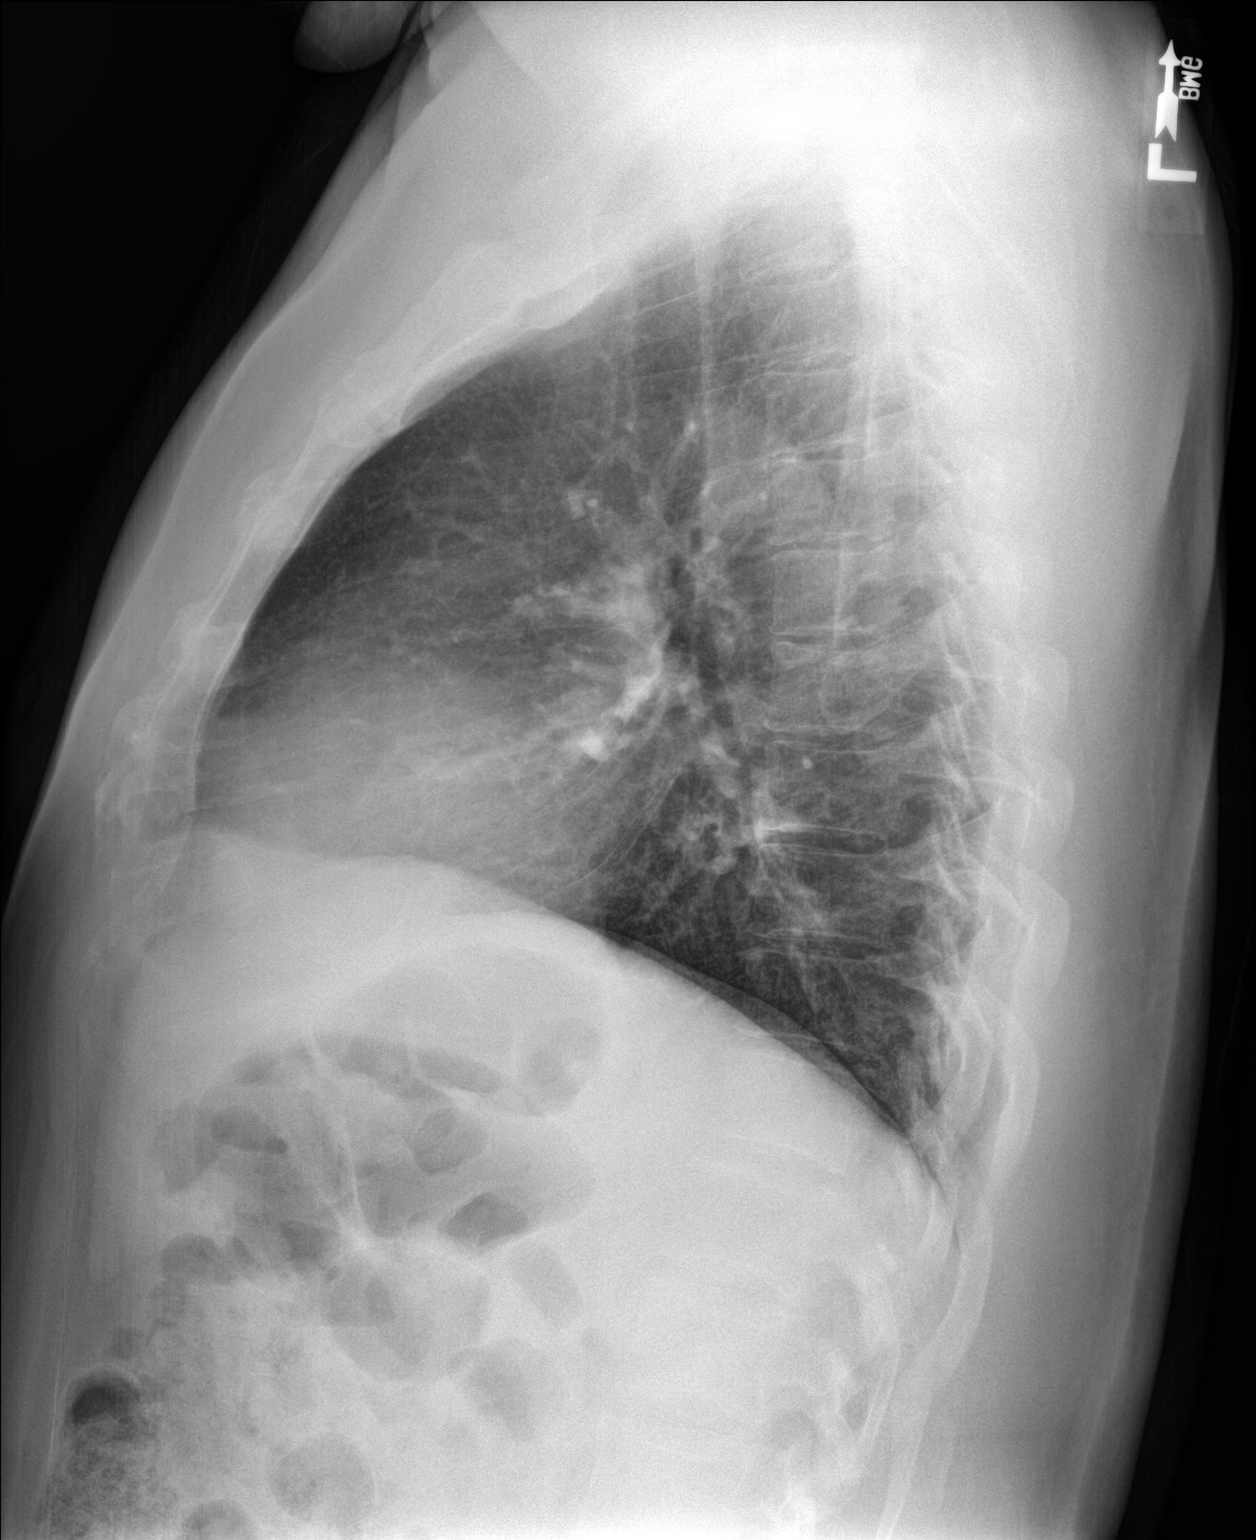

[2 of 2 positions shown; findings below may reference images not displayed]

FINDINGS: Right base airspace disease seen previously has resolved in the
interval. The left base airspace opacity has nearly resolved. No new
or progressive findings. The cardiopericardial silhouette is within
normal limits for size. The visualized bony structures of the thorax
are intact.
IMPRESSION: Interval improvement in right basilar and near complete resolution
of left basilar airspace disease seen previously.

## 2018-03-26 LAB — CBC AND DIFFERENTIAL
Hemoglobin: 6.2 — AB (ref 13.5–17.5)
WBC: 0

## 2018-04-20 DIAGNOSIS — I10 Essential (primary) hypertension: Secondary | ICD-10-CM | POA: Insufficient documentation

## 2018-05-16 ENCOUNTER — Encounter: Payer: Self-pay | Admitting: Cardiovascular Disease

## 2018-05-16 ENCOUNTER — Ambulatory Visit: Payer: Self-pay | Admitting: Cardiovascular Disease

## 2018-05-16 VITALS — BP 130/85 | HR 68 | Temp 97.9°F | Wt 251.3 lb

## 2018-05-16 DIAGNOSIS — I10 Essential (primary) hypertension: Secondary | ICD-10-CM

## 2018-05-16 NOTE — Progress Notes (Signed)
[facility]   Date:  05/16/2018   ID:  Ruben Mckinney, DOB 11/04/1959, MRN [id number]  PCP:  Patient, No Pcp Per    Chief Complaint  Patient presents with  . Follow-up    lab results  . Back Pain      History of Present Illness: Ruben Mckinney is a 57 y.o. male who presents for a follow-up visit regarding diabetes and to go over his lab work.  He has been under significant stress at work due to working more than 40 hours a week sometimes.  This leads to more sedentary lifestyle for him and more difficulty in controlling his risk factors.  He denies any chest pain or shortness of breath.  His blood pressure has been controlled with current medications. I reviewed his most recent labs with him which showed a hemoglobin A1c of 6.2.  Lipid profile was good but triglyceride was 240.    Past Medical History:  Diagnosis Date  . Hypertension     Past Surgical History:  Procedure Laterality Date  . HEMORROIDECTOMY  1988     Current Outpatient Medications  Medication Sig Dispense Refill  . amLODipine (NORVASC) 5 MG tablet Take 5 mg by mouth daily.    Marland Kitchen. lisinopril (PRINIVIL,ZESTRIL) 20 MG tablet Take 20 mg by mouth daily.     No current facility-administered medications for this visit.     Allergies:   Patient has no allergy information on record.    Social History:  The patient  reports that he quit smoking about 18 years ago. His smoking use included cigarettes. He has never used smokeless tobacco.   Family History:  The patient's family history is not on file.    ROS:  Please see the history of present illness.   Otherwise, review of systems are positive for none.   All other systems are reviewed and negative.    PHYSICAL EXAM: VS:  BP 130/85   Pulse 68   Temp 97.9 F (36.6 C)   Wt 251 lb 5.2 oz (114 kg)   SpO2 98%  , BMI There is no height or weight on file to calculate BMI. GEN: Well nourished, well developed, in no acute distress  HEENT: normal  Neck: no JVD,  carotid bruits, or masses Cardiac: RRR; no murmurs, rubs, or gallops,no edema  Respiratory:  clear to auscultation bilaterally, normal work of breathing GI: soft, nontender, nondistended, + BS MS: no deformity or atrophy  Skin: warm and dry, no rash Neuro:  Strength and sensation are intact Psych: euthymic mood, full affect   EKG:  EKG is not ordered today.    Recent Labs: No results found for requested labs within last 8760 hours.    Lipid Panel No results found for: CHOL, TRIG, HDL, CHOLHDL, VLDL, LDLCALC, LDLDIRECT    Wt Readings from Last 3 Encounters:  05/16/18 251 lb 5.2 oz (114 kg)       No flowsheet data found.    ASSESSMENT AND PLAN:  1.  Essential hypertension: Blood pressures controlled on current medications.  I made no changes today.  2.  Borderline type 2 diabetes: Hemoglobin A1c was 6.2.  I discussed with him the importance of healthy diet including low carbs and fat diet.  In addition, I advised him to start exercising and try to lose some weight.  Otherwise he might need to go on medications in few months. I provided him with a letter to his work not to work more than 40 hours  a week in order for him to focus on healthy lifestyle changes.  3.  Elevated triglyceride: His LDL was 45 but triglyceride was elevated.  I discussed with him the importance of diet and exercise.    Disposition:   FU in 3 months  Signed,  Lorine Bears, MD  05/16/2018 12:37 PM    [facility]

## 2018-05-16 NOTE — Patient Instructions (Signed)
Medication Instructions: No change in medication  Labwork: none  Procedures/Testing: none  Follow-Up: 3 months follow up  Any Additional Special Instructions Will Be Listed Below (If Applicable).     If you need a refill on your cardiac medications before your next appointment, please call your pharmacy.

## 2018-08-22 ENCOUNTER — Ambulatory Visit: Payer: Self-pay | Admitting: Internal Medicine

## 2018-09-05 ENCOUNTER — Ambulatory Visit: Payer: Self-pay | Admitting: Internal Medicine

## 2018-09-05 ENCOUNTER — Encounter: Payer: Self-pay | Admitting: Internal Medicine

## 2018-09-05 VITALS — BP 144/93 | HR 67 | Temp 98.2°F | Ht 71.0 in | Wt 254.0 lb

## 2018-09-05 DIAGNOSIS — K5903 Drug induced constipation: Secondary | ICD-10-CM

## 2018-09-05 DIAGNOSIS — Z711 Person with feared health complaint in whom no diagnosis is made: Secondary | ICD-10-CM

## 2018-09-05 DIAGNOSIS — I10 Essential (primary) hypertension: Secondary | ICD-10-CM

## 2018-09-05 MED ORDER — LISINOPRIL-HYDROCHLOROTHIAZIDE 20-12.5 MG PO TABS: 2.0000 | ORAL_TABLET | Freq: Every day | ORAL | 0 refills | Status: DC

## 2018-09-05 MED ORDER — POLYETHYLENE GLYCOL 3350 17 GM/SCOOP PO POWD: 17.0000 g | Freq: Two times a day (BID) | ORAL | 1 refills | Status: AC | PRN

## 2018-09-05 NOTE — Progress Notes (Signed)
  Subjective:     Patient ID: Ruben Mckinney, male   DOB: 11/04/1959, 57 y.o.   MRN: [id number]  C/o constipation since starting amlodipine, however BP is still poorly controlled. One BM every 3-4 days, denies abd pain, n/v.    Review of Systems  Gastrointestinal: Negative for abdominal distention and abdominal pain.       Objective:   Physical Exam  Constitutional: He appears well-developed. No distress.  HENT:  Head: Normocephalic.  Eyes: Pupils are equal, round, and reactive to light.  Cardiovascular: Normal rate, regular rhythm and normal heart sounds.  No murmur heard. Pulmonary/Chest: Effort normal. He has no wheezes. He has no rales.  Abdominal: Soft. Bowel sounds are normal. He exhibits no pulsatile midline mass. There is no tenderness.         Assessment:     Constipation Hypertension    Plan:     Miralax DC amlodipine and replace with lisinopril-hct

## 2018-09-22 DIAGNOSIS — Z711 Person with feared health complaint in whom no diagnosis is made: Secondary | ICD-10-CM | POA: Insufficient documentation

## 2018-11-07 ENCOUNTER — Ambulatory Visit: Payer: Self-pay | Admitting: Family Medicine

## 2018-11-07 VITALS — BP 136/88 | HR 64 | Temp 98.2°F | Ht 70.87 in | Wt 257.2 lb

## 2018-11-07 DIAGNOSIS — I10 Essential (primary) hypertension: Secondary | ICD-10-CM

## 2018-11-07 MED ORDER — LISINOPRIL-HYDROCHLOROTHIAZIDE 20-12.5 MG PO TABS: ORAL_TABLET | ORAL | 1 refills | Status: AC

## 2018-11-07 NOTE — Progress Notes (Signed)
S: No complain Need refill on BP meds On Exam: Heart normal Chest CTA Ext _ E,C,c  Assesment:  Essential hypertension - Plan: lisinopril-hydrochlorothiazide (ZESTORETIC) 20-12.5 MG tablet, Basic Metabolic Panel (BMET), TSH, Lipid Panel With LDL/HDL Ratio   Plan: Requested Prescriptions   Signed Prescriptions Disp Refills  . lisinopril-hydrochlorothiazide (ZESTORETIC) 20-12.5 MG tablet 180 tablet 1    Sig: 1 tab po bid

## 2020-06-14 ENCOUNTER — Other Ambulatory Visit: Payer: Self-pay

## 2020-06-14 MED ORDER — LISINOPRIL-HYDROCHLOROTHIAZIDE 20-12.5 MG PO TABS: 1.0000 | ORAL_TABLET | Freq: Every day | ORAL | 0 refills | Status: AC

## 2020-06-24 ENCOUNTER — Ambulatory Visit: Payer: Self-pay | Admitting: Internal Medicine

## 2022-07-31 ENCOUNTER — Other Ambulatory Visit: Payer: Self-pay

## 2022-07-31 DIAGNOSIS — Z125 Encounter for screening for malignant neoplasm of prostate: Secondary | ICD-10-CM

## 2022-08-05 ENCOUNTER — Other Ambulatory Visit: Payer: Self-pay | Admitting: *Deleted

## 2022-08-05 DIAGNOSIS — Z125 Encounter for screening for malignant neoplasm of prostate: Secondary | ICD-10-CM

## 2022-08-05 NOTE — Progress Notes (Signed)
Patient: Ruben Mckinney           Date of Birth: 1961-06-24           MRN: [id number] Visit Date: 08/05/2022 PCP: Patient, No Pcp Per  Prostate Cancer Screening Date of last physical exam:  (A long time per patient.) Date of last rectal exam:  (N/A) Have you ever had any of the following?: None Have you ever had or been told you have an allergy to latex products?: No Are you currently taking any natural prostate preparations?: No Are you currently experiencing any urinary symptoms?: Yes If yes, please explain::  (Patient did not state specific symptoms.)  Prostate Exam Exam not completed.  PSA only completed.  Patient's History Patient Active Problem List   Diagnosis Date Noted   No problem, feared complaint unfounded 09/22/2018   Hypertension    Essential hypertension 09/29/2017   Chronic pain of both knees 09/29/2017   Past Medical History:  Diagnosis Date   High blood pressure    patient states family history   Hypertension     Family History  Family history unknown: Yes    Social History   Occupational History   Not on file  Tobacco Use   Smoking status: Former    Types: Cigarettes    Quit date: 11/13/1999    Years since quitting: 22.7   Smokeless tobacco: Never  Substance and Sexual Activity   Alcohol use: No   Drug use: No   Sexual activity: Not on file

## 2022-08-06 LAB — PSA: Prostate Specific Ag, Serum: 0.8 ng/mL (ref 0.0–4.0)

## 2022-10-11 ENCOUNTER — Emergency Department (HOSPITAL_BASED_OUTPATIENT_CLINIC_OR_DEPARTMENT_OTHER): Payer: BC Managed Care – PPO

## 2022-10-11 ENCOUNTER — Encounter (HOSPITAL_BASED_OUTPATIENT_CLINIC_OR_DEPARTMENT_OTHER): Payer: Self-pay | Admitting: Emergency Medicine

## 2022-10-11 ENCOUNTER — Emergency Department (HOSPITAL_BASED_OUTPATIENT_CLINIC_OR_DEPARTMENT_OTHER)
Admission: EM | Admit: 2022-10-11 | Discharge: 2022-10-11 | Disposition: A | Discharge status: Home / Self Care | Payer: BC Managed Care – PPO | Attending: Emergency Medicine | Admitting: Emergency Medicine

## 2022-10-11 ENCOUNTER — Other Ambulatory Visit: Payer: Self-pay

## 2022-10-11 DIAGNOSIS — M79604 Pain in right leg: Secondary | ICD-10-CM | POA: Diagnosis not present

## 2022-10-11 DIAGNOSIS — Z79899 Other long term (current) drug therapy: Secondary | ICD-10-CM | POA: Diagnosis not present

## 2022-10-11 DIAGNOSIS — I10 Essential (primary) hypertension: Secondary | ICD-10-CM | POA: Insufficient documentation

## 2022-10-11 DIAGNOSIS — Y92410 Unspecified street and highway as the place of occurrence of the external cause: Secondary | ICD-10-CM | POA: Diagnosis not present

## 2022-10-11 DIAGNOSIS — M79621 Pain in right upper arm: Secondary | ICD-10-CM | POA: Insufficient documentation

## 2022-10-11 DIAGNOSIS — R0981 Nasal congestion: Secondary | ICD-10-CM | POA: Diagnosis not present

## 2022-10-11 DIAGNOSIS — M79641 Pain in right hand: Secondary | ICD-10-CM | POA: Diagnosis present

## 2022-10-11 MED ORDER — METHOCARBAMOL 500 MG PO TABS: 1000.0000 mg | ORAL_TABLET | Freq: Two times a day (BID) | ORAL | 0 refills | Status: AC

## 2022-10-11 MED ORDER — ACETAMINOPHEN 325 MG PO TABS: 650.0000 mg | ORAL_TABLET | Freq: Four times a day (QID) | ORAL | 0 refills | Status: AC | PRN

## 2022-10-11 MED ORDER — IBUPROFEN 600 MG PO TABS: 600.0000 mg | ORAL_TABLET | Freq: Four times a day (QID) | ORAL | 0 refills | Status: AC | PRN

## 2022-10-11 NOTE — ED Notes (Signed)
Discharge instructions, follow up care, and prescriptions reviewed and explained with pt and interpretor, pt verbalized understanding and had no further questions on d/c.
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
## 2022-10-11 NOTE — Discharge Instructions (Addendum)
It was a pleasure caring for you today in the emergency department. ° °Please return to the emergency department for any worsening or worrisome symptoms. ° ° °
